# Patient Record
Sex: Female | Born: 1960
Health system: Southern US, Community
[De-identification: ages and names within clinical notes are randomized; demographics above are authoritative.]

---

## 1999-11-20 ENCOUNTER — Other Ambulatory Visit: Admission: RE | Admit: 1999-11-20 | Discharge: 1999-11-20 | Payer: Self-pay | Admitting: Obstetrics and Gynecology

## 2000-12-08 ENCOUNTER — Other Ambulatory Visit: Admission: RE | Admit: 2000-12-08 | Discharge: 2000-12-08 | Payer: Self-pay | Admitting: Obstetrics and Gynecology

## 2002-01-30 ENCOUNTER — Other Ambulatory Visit: Admission: RE | Admit: 2002-01-30 | Discharge: 2002-01-30 | Payer: Self-pay | Admitting: Obstetrics and Gynecology

## 2002-03-02 ENCOUNTER — Ambulatory Visit (HOSPITAL_COMMUNITY): Admission: RE | Admit: 2002-03-02 | Discharge: 2002-03-02 | Payer: Self-pay | Admitting: General Surgery

## 2003-02-25 ENCOUNTER — Other Ambulatory Visit: Admission: RE | Admit: 2003-02-25 | Discharge: 2003-02-25 | Payer: Self-pay | Admitting: Obstetrics and Gynecology

## 2004-03-20 ENCOUNTER — Other Ambulatory Visit: Admission: RE | Admit: 2004-03-20 | Discharge: 2004-03-20 | Payer: Self-pay | Admitting: Obstetrics and Gynecology

## 2005-07-07 ENCOUNTER — Other Ambulatory Visit: Admission: RE | Admit: 2005-07-07 | Discharge: 2005-07-07 | Payer: Self-pay | Admitting: Obstetrics and Gynecology

## 2005-10-11 ENCOUNTER — Encounter (INDEPENDENT_AMBULATORY_CARE_PROVIDER_SITE_OTHER): Payer: Self-pay | Admitting: Specialist

## 2005-10-11 ENCOUNTER — Ambulatory Visit (HOSPITAL_COMMUNITY): Admission: RE | Admit: 2005-10-11 | Discharge: 2005-10-12 | Payer: Self-pay | Admitting: Obstetrics and Gynecology

## 2007-09-01 ENCOUNTER — Encounter (INDEPENDENT_AMBULATORY_CARE_PROVIDER_SITE_OTHER): Payer: Self-pay | Admitting: General Surgery

## 2007-09-01 ENCOUNTER — Ambulatory Visit (HOSPITAL_COMMUNITY): Admission: RE | Admit: 2007-09-01 | Discharge: 2007-09-01 | Payer: Self-pay | Admitting: General Surgery

## 2010-02-05 ENCOUNTER — Ambulatory Visit (HOSPITAL_COMMUNITY): Admission: RE | Admit: 2010-02-05 | Discharge: 2010-02-05 | Payer: Self-pay | Admitting: Neurological Surgery

## 2010-08-28 LAB — COMPREHENSIVE METABOLIC PANEL
Albumin: 4.3 g/dL (ref 3.5–5.2)
BUN: 9 mg/dL (ref 6–23)
CO2: 27 mEq/L (ref 19–32)
Chloride: 104 mEq/L (ref 96–112)
Glucose, Bld: 105 mg/dL — ABNORMAL HIGH (ref 70–99)
Total Bilirubin: 0.9 mg/dL (ref 0.3–1.2)

## 2010-08-28 LAB — LIPID PANEL
HDL: 50 mg/dL (ref >39)
LDL Cholesterol: 136 mg/dL — ABNORMAL HIGH (ref 0–99)
Triglycerides: 121 mg/dL (ref <150)
VLDL: 24 mg/dL (ref 0–40)

## 2010-08-28 LAB — CBC
HCT: 43 % (ref 36.0–46.0)
HCT: 43.4 % (ref 36.0–46.0)
Hemoglobin: 14.3 g/dL (ref 12.0–15.0)
Hemoglobin: 14.6 g/dL (ref 12.0–15.0)
MCH: 29.8 pg (ref 26.0–34.0)
MCH: 30.2 pg (ref 26.0–34.0)
MCHC: 33.6 g/dL (ref 30.0–36.0)
MCV: 89.6 fL (ref 78.0–100.0)
MCV: 89.9 fL (ref 78.0–100.0)
Platelets: 324 10*3/uL (ref 150–400)
RBC: 4.8 MIL/uL (ref 3.87–5.11)

## 2010-08-28 LAB — SURGICAL PCR SCREEN
MRSA, PCR: NEGATIVE
Staphylococcus aureus: NEGATIVE

## 2010-10-27 NOTE — H&P (Signed)
[name], [name]              ACCOUNT NO.:  [id number]   MEDICAL RECORD NO.:  [id number]           PATIENT TYPE:  AMB   LOCATION:  DAY                           FACILITY:  [facility]   PHYSICIAN:  Dalia Heading, M.D.  DATE OF BIRTH:  1961/02/20   DATE OF ADMISSION:  DATE OF DISCHARGE:  LH                              HISTORY & PHYSICAL   CHIEF COMPLAINT:  Abdominal pain, diarrhea.   HISTORY OF PRESENT ILLNESS:  The patient is a 50 year old white female  who is referred for endoscopic evaluation.  She needs a colonoscopy for  abdominal pain, diarrhea, constipation.  She has been having these  symptoms over the past few months.  Amitiza makes her feel better.  No  weight loss, nausea, vomiting, melena, hematochezia has been noted.  She  has never had a colonoscopy.  A maternal grandfather had colon cancer.   PAST MEDICAL HISTORY:  Includes depression.   PAST SURGICAL HISTORY:  Laparoscopic cholecystectomy.   CURRENT MEDICATIONS:  Nexium, Claritin D, trazodone, Amitiza.   ALLERGIES:  SULFA AND PENICILLIN.   REVIEW OF SYSTEMS:  The patient denies drinking or smoking.  She denies  any other cardiopulmonary difficulties or bleeding disorders.   PHYSICAL EXAMINATION:  GENERAL:  The patient is a well-developed, and  well-nourished white female in no acute distress.  LUNGS:  Clear to auscultation with equal breath sounds bilaterally.  HEART:  Reveals a regular rate and rhythm without S3, S4, or murmurs.  ABDOMEN:  Soft, nontender, nondistended.  No hepatosplenomegaly or  masses are noted.  RECTAL:  Deferred until the procedure.   IMPRESSION:  Abdominal pain, diarrhea.   PLAN:  The patient is scheduled for a colonoscopy on September 01, 2007.  The risks and benefits of the procedure including bleeding and  perforation were fully explained to the patient, gave informed consent.      Dalia Heading, M.D.  Electronically Signed     MAJ/MEDQ  D:  08/08/2007  T:  08/09/2007  Job:   [id number]   cc:   Donzetta Sprung  Fax: [phone]

## 2010-10-27 NOTE — H&P (Signed)
[name], [name]              ACCOUNT NO.:  [id number]   MEDICAL RECORD NO.:  [id number]           PATIENT TYPE:  AMB   LOCATION:  DAY                           FACILITY:  [facility]   PHYSICIAN:  Dalia Heading, M.D.  DATE OF BIRTH:  Mar 10, 1961   DATE OF ADMISSION:  DATE OF DISCHARGE:  LH                              HISTORY & PHYSICAL   CHIEF COMPLAINT:  Abdominal pain, diarrhea.   HISTORY OF PRESENT ILLNESS:  The patient is a 50 year old white female  who is referred for endoscopic evaluation.  She needs a colonoscopy for  abdominal pain, diarrhea, constipation.  She has been having these  symptoms over the past few months.  Amitiza makes her feel better.  No  weight loss, nausea, vomiting, melena, hematochezia has been noted.  She  has never had a colonoscopy.  A maternal grandfather had colon cancer.   PAST MEDICAL HISTORY:  Includes depression.   PAST SURGICAL HISTORY:  Laparoscopic cholecystectomy.   CURRENT MEDICATIONS:  Nexium, Claritin D, trazodone, Amitiza.   ALLERGIES:  SULFA AND PENICILLIN.   REVIEW OF SYSTEMS:  The patient denies drinking or smoking.  She denies  any other cardiopulmonary difficulties or bleeding disorders.   PHYSICAL EXAMINATION:  GENERAL:  The patient is a well-developed, and  well-nourished white female in no acute distress.  LUNGS:  Clear to auscultation with equal breath sounds bilaterally.  HEART:  Reveals a regular rate and rhythm without S3, S4, or murmurs.  ABDOMEN:  Soft, nontender, nondistended.  No hepatosplenomegaly or  masses are noted.  RECTAL:  Deferred until the procedure.   IMPRESSION:  Abdominal pain, diarrhea.   PLAN:  The patient is scheduled for a colonoscopy on September 01, 2007.  The risks and benefits of the procedure including bleeding and  perforation were fully explained to the patient, gave informed consent.      Dalia Heading, M.D.  Electronically Signed     MAJ/MEDQ  D:  08/08/2007  T:  08/09/2007  Job:   [id number]   cc:   Donzetta Sprung  Fax: [phone]

## 2010-10-30 NOTE — H&P (Signed)
NAMEJACKLIN, [name]              ACCOUNT NO.:  [id number]   MEDICAL RECORD NO.:  [id number]          PATIENT TYPE:  AMB   LOCATION:  SDC                           FACILITY:  [facility]   PHYSICIAN:  Duke Salvia. Marcelle Overlie, M.D.DATE OF BIRTH:  [birth year]   DATE OF ADMISSION:  DATE OF DISCHARGE:                                HISTORY & PHYSICAL   DATE OF SURGERY:  Patient is scheduled for surgery October 11, 2005 at [facility].   CHIEF COMPLAINT:  Post-coital bleeding, abnormal uterine bleeding,  dyspareunia.   HISTORY OF PRESENT ILLNESS:  The patient is a 50 year old G1, P1.  Her  husband has had a vasectomy.  Over the last six to nine months this patient  has noticed some worsening spotting after intercourse and also deep  dyspareunia.  Ultrasound in our office showed a 5 mm polyp but no other  abnormalities noted.  Her last Pap in January 2007 was class I.   PAST MEDICAL HISTORY:  Patient delivered vaginally in 1997.   SOCIAL HISTORY:  She has one daughter.   ALLERGIES:  PENICILLIN, CODEINE.   CURRENT MEDICATIONS:  Nexium and Zyrtec.   REVIEW OF SYSTEMS:  Significant for a history of anovulation.  At one point  she was on Clomid for relative infertility.   FAMILY HISTORY:  Otherwise remarkable for mother and brother who both have  only one kidney.   PHYSICAL EXAMINATION:  VITAL SIGNS:  Temperature 98.2, blood pressure  120/72.  HEENT:  Unremarkable.  NECK:  Supple without masses.  LUNGS:  Clear.  CARDIOVASCULAR:  Regular rate and rhythm without murmurs, rubs or gallops  noted.  BREASTS:  Without masses.  ABDOMEN:  Soft, flat, nontender.  PELVIC:  Examination shows normal external vagina. Vulva and cervix clear.  Uterus mid position, normal size.  Adnexa negative.  EXTREMITIES:  Unremarkable.  NEUROLOGICAL:  Unremarkable.   IMPRESSION:  Endometrial polyp with history of abnormal bleeding,  dyspareunia and post-coital bleeding.   PLAN:  Laparoscopically assisted  vaginal hysterectomy.  This procedure  including risks of bleeding, infection, adjacent organ injury, the possible  need for open or additional surgery along with her expectant recovery time  were all reviewed with her, which she understands and accepts.      Richard M. Marcelle Overlie, M.D.  Electronically Signed     RMH/MEDQ  D:  10/05/2005  T:  10/05/2005  Job:  [id number]

## 2010-10-30 NOTE — Op Note (Signed)
[name], [name]              ACCOUNT NO.:  [id number]   MEDICAL RECORD NO.:  [id number]          PATIENT TYPE:  AMB   LOCATION:  SDC                           [facility]ACILITY:  [facility]   PHYSICIAN:  Duke Salvia. Marcelle Overlie, M.D.DATE OF BIRTH:  1960/10/28   DATE OF PROCEDURE:  10/11/2005  DATE OF DISCHARGE:                                 OPERATIVE REPORT   PREOP DIAGNOSIS:  Dyspareunia abnormal uterine bleeding.   POSTOP DIAGNOSIS:  Dyspareunia abnormal uterine bleeding plus endometriosis.   PROCEDURE:  Diagnostic laparoscopy with laparoscopically assisted vaginal  hysterectomy.   SURGEON:  Duke Salvia. Marcelle Overlie, M.D.   ASSISTANT:  Zelphia Cairo, MD   ESTIMATED BLOOD LOSS:  100 mL.   SPECIMENS REMOVED:  Uterus.   PROCEDURE AND FINDINGS:  The patient was taken to the operating room after  an adequate level of general endotracheal anesthesia was obtained. With the  patient's legs in the stirrups, the abdomen, perineum, and vagina were  prepped and draped with Betadine.  The bladder was drained.  An EUA carried  out.  The uterus was in midposition, normal size, mobile, adnexa negative.  A Hulka tenaculum was positioned.   Attention directed to the abdomen where a 2 cm subumbilical incision was  made after infiltrating with 1/2% Marcaine plain.  The Veress needle was  introduced without difficulty.  Its intra-abdominal position was verified by  pressure and water testing.  After a 2.5 liter pneumoperitoneum was then  created a laparoscopic trocar and sleeve were then introduced without  difficulty.  Three fingerbreadths above the symphysis, in the midline, a 5-  mm trocar was inserted without difficulty.  Her bladder had been drained  preoperatively.  She was then placed in Trendelenburg and pelvic findings  were inspected.  The anterior and posterior cul-de-sac was unremarkable.  Bilateral tubes and ovaries were normal.  There was a single implant of  superficial endometriosis on the  left pelvic sidewall above the course of  the pelvic ureter on that side.  The gyrus PK instrument was then used to  coagulate and cut the utero-ovarian pedicle, down to and including the round  ligament on each side with excellent hemostasis.  After the course of the  ureter was traced on the left side and could be seen well below, the  solitary implant along the left pelvic sidewall was coagulated superficially  with the gyrus PK instrument.  This was not excised.  The vaginal portion of  the procedure was started at that point.   A weighted speculum was positioned.  Her legs were extended.  The cervical  vaginal mucosa was incised with a Bovie.  Posterior colpotomy performed  without difficulty.  The bladder was advanced superiorly.  The uterosacral  ligament was coagulated and cut with the handheld gyrus PK instrument.  The  anterior peritoneum could be identified; was entered sharply; and the  bladder was gently elevated out of the field.  A small anterior lower fundal  fibroid was removed.  In sequential manner the cardinal ligament and uterine  vascular pedicles were coagulated and cut with the gyrus PK  instrument.  The  fundus of the uterus was then delivered posteriorly.  The remainder of the  pedicles were clamped, cut, and free tied with #0 Vicryl suture.  The cuff  was closed from 3-9 o'clock with a running locked 2-0 Vicryl suture.  A  McCall's culdoplasty suture was then positioned taking up the posterior  peritoneum.  After left uterosacral leg ligament across the right  uterosacral ligament was then tied for extra posterior support.  Prior to  closure the sponge, needle, and instrument counts were reported as correct  x2.  Then a 2-0 Monocryl suture was then used to approximate the vaginal  mucosa from right to left with interrupted sutures.  A Foley catheter  positioned draining clear urine.   Repeat laparoscopy carried out, at this point, The _Nehzhat_ was used to   irrigate and aspirate with reduced pressure.  All major pedicles were noted  to be hemostatic.  Instruments were removed.  Gas was allowed to escape.  The defect was closed with 4-0 Vicryl subcuticular sutures and Dermabond.  She tolerated this well and went to recovery room in good condition.      Richard M. Marcelle Overlie, M.D.  Electronically Signed     RMH/MEDQ  D:  10/11/2005  T:  10/11/2005  Job:  [id number]

## 2010-10-30 NOTE — Op Note (Signed)
[name], [name]                          ACCOUNT NO.:  [id number]   MEDICAL RECORD NO.:  [id number]                   PATIENT TYPE:  AMB   LOCATION:  DAY                                  FACILITY:  [facility]   PHYSICIAN:  Dalia Heading, M.D.               DATE OF BIRTH:  08-01-1960   DATE OF PROCEDURE:  03/02/2002  DATE OF DISCHARGE:                                 OPERATIVE REPORT   PREOPERATIVE DIAGNOSIS:  Chronic cholecystitis, cholelithiasis.   POSTOPERATIVE DIAGNOSIS:  Chronic cholecystitis, cholelithiasis.   PROCEDURE:  Laparoscopic cholecystectomy   SURGEON:  Dalia Heading, M.D.   ANESTHESIA:  General endotracheal   INDICATIONS:  The patient is a 50 year old white female who presents with  biliary colic secondary to cholelithiasis.  The risks and benefits of the  procedure including bleeding, infection, hepatobiliary injury, and the  possibility of an open procedure were fully explained to the patient, who  gave informed consent.   DESCRIPTION OF PROCEDURE:  The patient was placed in the supine position.  After induction of general endotracheal anesthesia, the abdomen was prepped  and draped using the usual sterile technique with Betadine.   An infraumbilical incision was made down to the fascia.  A Veress needle was  introduced into the abdominal cavity and confirmation of placement was done  using the saline drop test.  The abdomen was then insufflated to 16 mmHg  pressure.  An 11-mm trocar was introduced into the abdominal cavity under  direct visualization without difficulty.  The patient was placed then in  reverse Trendelenburg position and an additional 11-mm trocar was placed in  the epigastric region and 5-mm trocars were placed in the right upper  quadrant and right flank regions. The liver was inspected and noted to be  within normal limits.  The gallbladder was retracted superiorly and  laterally.  The dissection was begun around the infundibulum of  the  gallbladder.  The cystic duct was first identified.  Its junction to the  infundibulum fully identified.  Endoclips were placed proximally and  distally on the cystic duct; and the cystic duct was divided.  This was  likewise done on the cystic artery.  The gallbladder was then freed away  from the gallbladder fossa using Bovie electrocautery.  The gallbladder was  delivered through the epigastric trocar site using an EndoCatch bag.  The  gallbladder fossa was inspected and no abnormal bleeding or bile leakage was  noted.  Surgicel was placed in the gallbladder fossa, the subhepatic space,  as well as the right hepatic gutter irrigated with normal saline.  All fluid  and air were then evacuated from the abdominal cavity prior to removal of  the trocars.   All wounds were irrigated with normal saline.  All wounds were injected with  0.5% Sensorcaine.  The epigastric fascia as well as infraumbilical fascia  were reapproximated using an #0  Vicryl interrupted suture. All skin  incisions were closed using staples.  Betadine ointment and dry sterile  dressings were applied.   All tape and needle counts correct at the end of the procedure.  The patient  was extubated in the operating room and went back to recovery room in awake  and stable condition.   COMPLICATIONS:  None.   SPECIMENS:  Gallbladder with stones.   BLOOD LOSS:  Minimal.                                               Dalia Heading, M.D.    MAJ/MEDQ  D:  03/02/2002  T:  03/04/2002  Job:  [id number]   cc:   Donzetta Sprung  [address]  [place]  [place] [address]  Fax: [phone]

## 2010-10-30 NOTE — H&P (Signed)
   NAME:  [name], [name] NO.:  [id number]   MEDICAL RECORD NO.:  [id number]                   PATIENT TYPE:   LOCATION:                                       FACILITY:   PHYSICIAN:  Dalia Heading, M.D.               DATE OF BIRTH:  [birth year]   DATE OF ADMISSION:  DATE OF DISCHARGE:                                HISTORY & PHYSICAL   CHIEF COMPLAINT:  Biliary colic, cholelithiasis.   HISTORY OF PRESENT ILLNESS:  The patient is a 50 year old white female who  is referred for evaluation and treatment of biliary colic secondary to  cholelithiasis.  She has been having right upper quadrant abdominal pain,  indigestion, and nausea intermittently for many months.  It seems to be  getting worse.  No fever, chills, emesis, or jaundice have been noted.   PAST MEDICAL HISTORY:  1. GERD.  2. Extrinsic allergies.   PAST SURGICAL HISTORY:  Unremarkable.   CURRENT MEDICATIONS:  Zyrtec, Nexium, Rhinocort.   ALLERGIES:  PENICILLIN.   REVIEW OF SYSTEMS:  Unremarkable.   PHYSICAL EXAMINATION:  GENERAL:  Well-developed, well-nourished white female  in no acute distress.  VITAL SIGNS:  Afebrile, and vital signs are stable.  HEENT:  No scleral icterus.  LUNGS:  Clear to auscultation with equal breath sounds bilaterally.  HEART:  Regular rate and rhythm.  Without S3, S4, or murmurs.  ABDOMEN:  Soft, with slight tenderness noted in the right upper quadrant to  palpation.  No hepatosplenomegaly, masses, or hernias are identified.   LABORATORY DATA:  Ultrasound of the gallbladder reveals cholelithiasis with  a normal common bile duct.   IMPRESSION:  Biliary colic, cholelithiasis.    PLAN:  The patient is scheduled for laparoscopic cholecystectomy on  March 02, 2002.  The risks and benefits of the procedure including  bleeding, infection, hepatobiliary injury, and the possibility of an open  procedure were fully explained to the patient, who gave informed  consent.                                                  Dalia Heading, M.D.    MAJ/MEDQ  D:  02/08/2002  T:  02/08/2002  Job:  [id number]   cc:   Donzetta Sprung, M.D.  [employer]

## 2010-10-30 NOTE — Discharge Summary (Signed)
NAMEHELEN, [name]              ACCOUNT NO.:  [id number]   MEDICAL RECORD NO.:  [id number]          PATIENT TYPE:  OIB   LOCATION:  9305                          FACILITY:  [facility]   PHYSICIAN:  Duke Salvia. Marcelle Overlie, M.D.DATE OF BIRTH:  1960/11/20   DATE OF ADMISSION:  10/11/2005  DATE OF DISCHARGE:  10/12/2005                                 DISCHARGE SUMMARY   DISCHARGE DIAGNOSES:  1.  Menorrhagia, dyspareunia.  2.  Laparoscopic assisted vaginal hysterectomy this admission.  3.  Mild pelvic endometriosis.  4.  Leiomyoma.   For summary of the history and physical exam please refer to admission H&P  for details. Briefly, a 50 year old who has symptomatic menorrhagia with  dyspareunia and presents for definitive hysterectomy.   HOSPITAL COURSE:  On October 11, 2005 under general anesthesia the patient  underwent laparoscopic assisted vaginal hysterectomy with conservation of  both ovaries, minimal peritoneal endometriosis was noted. On the first  postoperative day the catheter was removed that morning. Hemoglobin 9.9. She  voided without difficulty, was afebrile, tolerating a regular diet and was  ready for discharge by 1 p.m.   OTHER LAB DATA:  CBC on admission:  WBC 8200, hemoglobin 14.5, hematocrit  42.6. Blood type is O positive, antibody screen was negative. On Oct 12, 2005  WBC 18 thousand, hemoglobin 9.9, hematocrit 29.1.   DISPOSITION:  The patient was discharged on Tylox p.r.n. pain. Will return  to the office in 3 days. Advised her to report any incisional redness or  drainage, increased pain or bleeding or fever over 101. She was given  specific instructions regarding diet, sex, exercise.   CONDITION ON DISCHARGE:  Good.   ACTIVITY:  Graded increase.      Richard M. Marcelle Overlie, M.D.  Electronically Signed     RMH/MEDQ  D:  10/12/2005  T:  10/12/2005  Job:  [id number]

## 2011-04-16 IMAGING — CR DG CERVICAL SPINE 2 OR 3 VIEWS
1 series · 1 of 1 positions shown · non-contrast
Comparison: None

CLINICAL DATA: ACDF.

CERVICAL SPINE - 2-3 VIEW

[view not recorded]
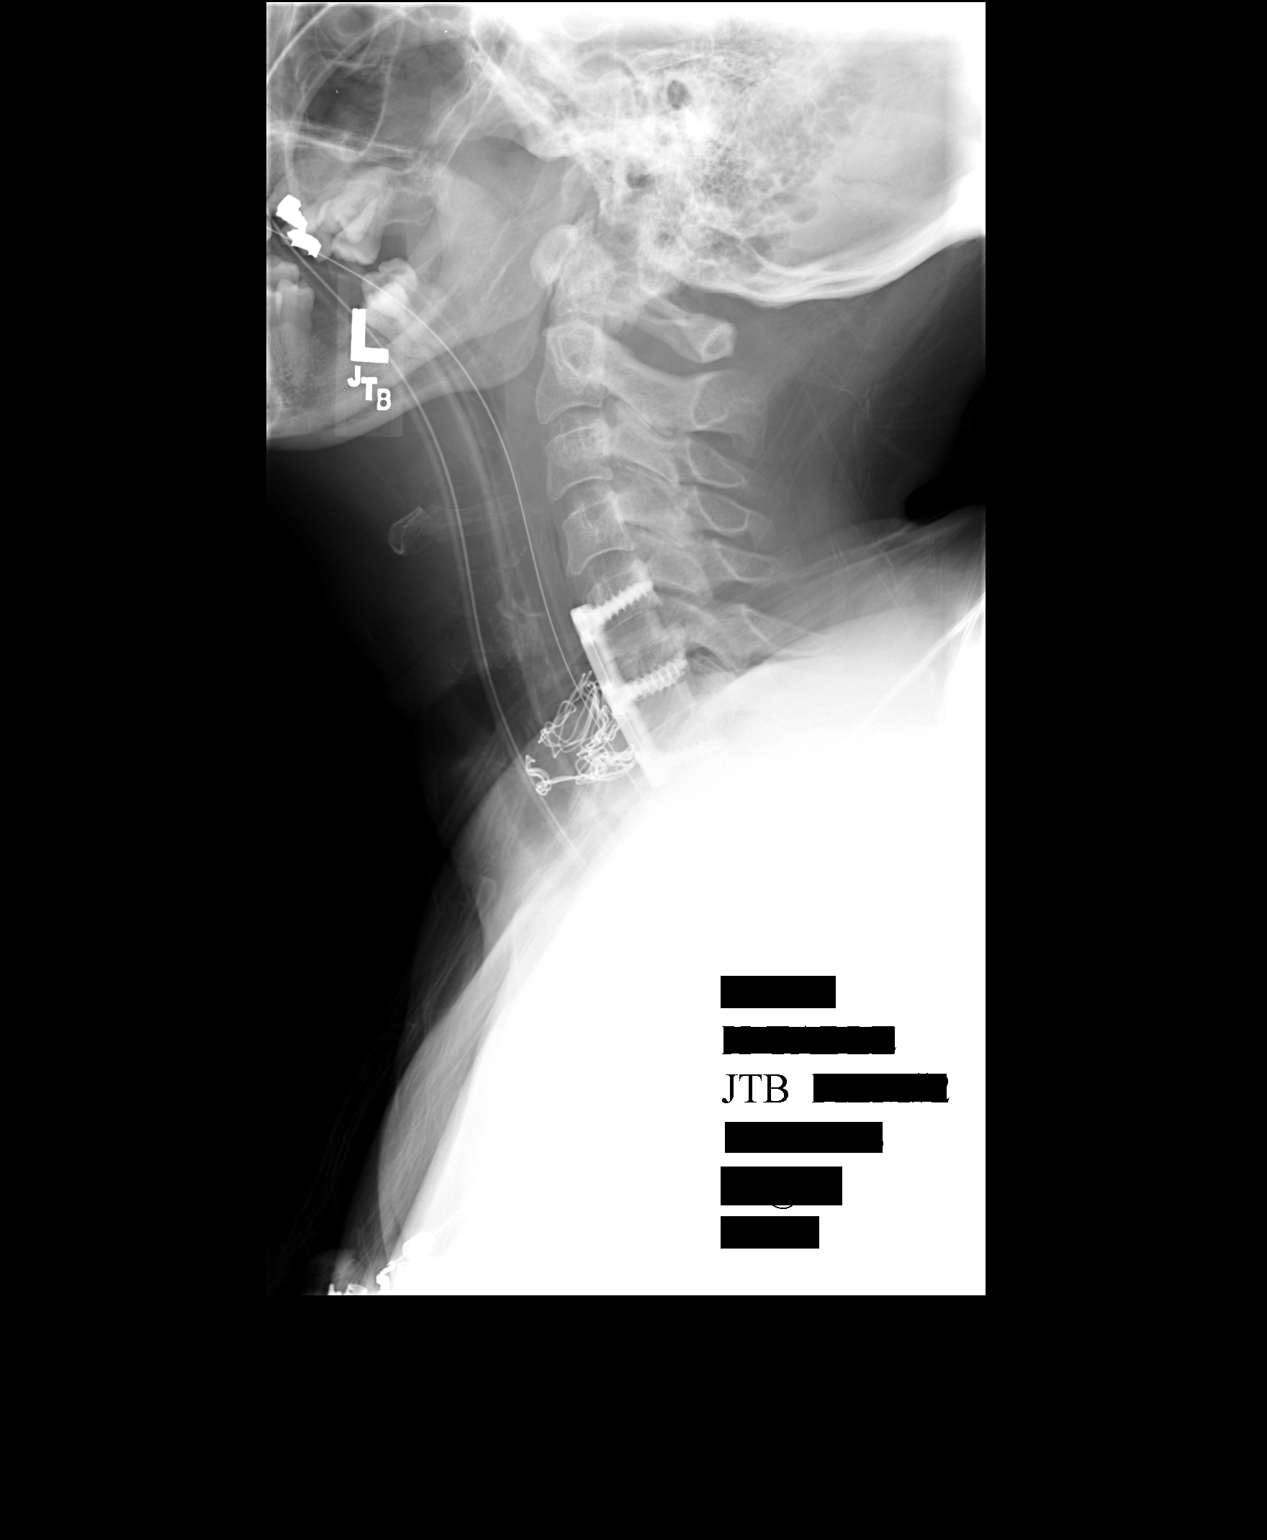

[1 of 1 positions shown; findings below may reference images not displayed]

FINDINGS: First lateral intraoperative image demonstrates anterior
localizing instrument at C5-6.

Second lateral intraoperative image demonstrates changes of ACDF
from C5-C7.  Normal alignment.  The C7 vertebral body is not well
visualized.  The screws at C5 and C6 are near the posterior edge of
the vertebral body.  There is questionable extension of one of the
C6 screws through the posterior aspect of the C6 vertebral body.
Dedicated c-spine series may be helpful when the patient is able.
IMPRESSION: ACDF C5-C7 as above.  Questionable slight extension of one of the
C6 vertebral screws through the posterior aspect of C6.

## 2015-10-02 DIAGNOSIS — F419 Anxiety disorder, unspecified: Secondary | ICD-10-CM | POA: Diagnosis not present

## 2015-10-10 DIAGNOSIS — Z01419 Encounter for gynecological examination (general) (routine) without abnormal findings: Secondary | ICD-10-CM | POA: Diagnosis not present

## 2015-10-10 DIAGNOSIS — Z1231 Encounter for screening mammogram for malignant neoplasm of breast: Secondary | ICD-10-CM | POA: Diagnosis not present

## 2015-10-10 DIAGNOSIS — Z6826 Body mass index (BMI) 26.0-26.9, adult: Secondary | ICD-10-CM | POA: Diagnosis not present

## 2015-11-24 DIAGNOSIS — L299 Pruritus, unspecified: Secondary | ICD-10-CM | POA: Diagnosis not present

## 2015-12-08 DIAGNOSIS — R300 Dysuria: Secondary | ICD-10-CM | POA: Diagnosis not present

## 2016-02-05 DIAGNOSIS — K589 Irritable bowel syndrome without diarrhea: Secondary | ICD-10-CM | POA: Diagnosis not present

## 2016-02-05 DIAGNOSIS — J301 Allergic rhinitis due to pollen: Secondary | ICD-10-CM | POA: Diagnosis not present

## 2016-02-05 DIAGNOSIS — F331 Major depressive disorder, recurrent, moderate: Secondary | ICD-10-CM | POA: Diagnosis not present

## 2016-02-05 DIAGNOSIS — K219 Gastro-esophageal reflux disease without esophagitis: Secondary | ICD-10-CM | POA: Diagnosis not present

## 2016-02-05 DIAGNOSIS — Z1212 Encounter for screening for malignant neoplasm of rectum: Secondary | ICD-10-CM | POA: Diagnosis not present

## 2016-02-26 DIAGNOSIS — J019 Acute sinusitis, unspecified: Secondary | ICD-10-CM | POA: Diagnosis not present

## 2016-03-29 DIAGNOSIS — Z23 Encounter for immunization: Secondary | ICD-10-CM | POA: Diagnosis not present

## 2016-05-03 DIAGNOSIS — Z23 Encounter for immunization: Secondary | ICD-10-CM | POA: Diagnosis not present

## 2016-05-31 DIAGNOSIS — Z6827 Body mass index (BMI) 27.0-27.9, adult: Secondary | ICD-10-CM | POA: Diagnosis not present

## 2016-05-31 DIAGNOSIS — J019 Acute sinusitis, unspecified: Secondary | ICD-10-CM | POA: Diagnosis not present

## 2016-07-07 DIAGNOSIS — R300 Dysuria: Secondary | ICD-10-CM | POA: Diagnosis not present

## 2016-08-05 DIAGNOSIS — J301 Allergic rhinitis due to pollen: Secondary | ICD-10-CM | POA: Diagnosis not present

## 2016-08-05 DIAGNOSIS — F331 Major depressive disorder, recurrent, moderate: Secondary | ICD-10-CM | POA: Diagnosis not present

## 2016-08-05 DIAGNOSIS — K589 Irritable bowel syndrome without diarrhea: Secondary | ICD-10-CM | POA: Diagnosis not present

## 2016-08-05 DIAGNOSIS — K219 Gastro-esophageal reflux disease without esophagitis: Secondary | ICD-10-CM | POA: Diagnosis not present

## 2016-08-23 DIAGNOSIS — J069 Acute upper respiratory infection, unspecified: Secondary | ICD-10-CM | POA: Diagnosis not present

## 2016-08-23 DIAGNOSIS — L299 Pruritus, unspecified: Secondary | ICD-10-CM | POA: Diagnosis not present

## 2016-11-24 DIAGNOSIS — R300 Dysuria: Secondary | ICD-10-CM | POA: Diagnosis not present

## 2017-02-11 DIAGNOSIS — Z01419 Encounter for gynecological examination (general) (routine) without abnormal findings: Secondary | ICD-10-CM | POA: Diagnosis not present

## 2017-02-11 DIAGNOSIS — Z6825 Body mass index (BMI) 25.0-25.9, adult: Secondary | ICD-10-CM | POA: Diagnosis not present

## 2017-02-11 DIAGNOSIS — Z1231 Encounter for screening mammogram for malignant neoplasm of breast: Secondary | ICD-10-CM | POA: Diagnosis not present

## 2017-03-16 DIAGNOSIS — Z23 Encounter for immunization: Secondary | ICD-10-CM | POA: Diagnosis not present

## 2017-04-28 DIAGNOSIS — J019 Acute sinusitis, unspecified: Secondary | ICD-10-CM | POA: Diagnosis not present

## 2017-04-28 DIAGNOSIS — Z6825 Body mass index (BMI) 25.0-25.9, adult: Secondary | ICD-10-CM | POA: Diagnosis not present

## 2017-05-09 DIAGNOSIS — K219 Gastro-esophageal reflux disease without esophagitis: Secondary | ICD-10-CM | POA: Diagnosis not present

## 2017-05-09 DIAGNOSIS — K589 Irritable bowel syndrome without diarrhea: Secondary | ICD-10-CM | POA: Diagnosis not present

## 2017-05-09 DIAGNOSIS — F331 Major depressive disorder, recurrent, moderate: Secondary | ICD-10-CM | POA: Diagnosis not present

## 2017-05-09 DIAGNOSIS — J301 Allergic rhinitis due to pollen: Secondary | ICD-10-CM | POA: Diagnosis not present

## 2017-06-28 DIAGNOSIS — J019 Acute sinusitis, unspecified: Secondary | ICD-10-CM | POA: Diagnosis not present

## 2017-09-13 DIAGNOSIS — J309 Allergic rhinitis, unspecified: Secondary | ICD-10-CM | POA: Diagnosis not present

## 2017-09-22 DIAGNOSIS — R1084 Generalized abdominal pain: Secondary | ICD-10-CM | POA: Diagnosis not present

## 2017-09-22 DIAGNOSIS — Z6825 Body mass index (BMI) 25.0-25.9, adult: Secondary | ICD-10-CM | POA: Diagnosis not present

## 2017-09-22 DIAGNOSIS — R1110 Vomiting, unspecified: Secondary | ICD-10-CM | POA: Diagnosis not present

## 2017-11-14 DIAGNOSIS — K219 Gastro-esophageal reflux disease without esophagitis: Secondary | ICD-10-CM | POA: Diagnosis not present

## 2017-11-14 DIAGNOSIS — K589 Irritable bowel syndrome without diarrhea: Secondary | ICD-10-CM | POA: Diagnosis not present

## 2017-11-14 DIAGNOSIS — J301 Allergic rhinitis due to pollen: Secondary | ICD-10-CM | POA: Diagnosis not present

## 2017-11-14 DIAGNOSIS — F331 Major depressive disorder, recurrent, moderate: Secondary | ICD-10-CM | POA: Diagnosis not present

## 2017-11-14 DIAGNOSIS — J0100 Acute maxillary sinusitis, unspecified: Secondary | ICD-10-CM | POA: Diagnosis not present

## 2018-01-13 DIAGNOSIS — J019 Acute sinusitis, unspecified: Secondary | ICD-10-CM | POA: Diagnosis not present

## 2018-01-13 DIAGNOSIS — Z6824 Body mass index (BMI) 24.0-24.9, adult: Secondary | ICD-10-CM | POA: Diagnosis not present

## 2018-03-22 DIAGNOSIS — Z23 Encounter for immunization: Secondary | ICD-10-CM | POA: Diagnosis not present

## 2018-04-10 DIAGNOSIS — R0981 Nasal congestion: Secondary | ICD-10-CM | POA: Diagnosis not present

## 2018-04-10 DIAGNOSIS — Z6826 Body mass index (BMI) 26.0-26.9, adult: Secondary | ICD-10-CM | POA: Diagnosis not present

## 2018-04-10 DIAGNOSIS — H9203 Otalgia, bilateral: Secondary | ICD-10-CM | POA: Diagnosis not present

## 2018-04-13 DIAGNOSIS — Z01419 Encounter for gynecological examination (general) (routine) without abnormal findings: Secondary | ICD-10-CM | POA: Diagnosis not present

## 2018-04-13 DIAGNOSIS — Z1231 Encounter for screening mammogram for malignant neoplasm of breast: Secondary | ICD-10-CM | POA: Diagnosis not present

## 2018-04-13 DIAGNOSIS — Z6825 Body mass index (BMI) 25.0-25.9, adult: Secondary | ICD-10-CM | POA: Diagnosis not present

## 2018-05-22 DIAGNOSIS — Z6825 Body mass index (BMI) 25.0-25.9, adult: Secondary | ICD-10-CM | POA: Diagnosis not present

## 2018-05-22 DIAGNOSIS — J019 Acute sinusitis, unspecified: Secondary | ICD-10-CM | POA: Diagnosis not present

## 2018-05-31 DIAGNOSIS — K219 Gastro-esophageal reflux disease without esophagitis: Secondary | ICD-10-CM | POA: Diagnosis not present

## 2018-05-31 DIAGNOSIS — J301 Allergic rhinitis due to pollen: Secondary | ICD-10-CM | POA: Diagnosis not present

## 2018-05-31 DIAGNOSIS — K589 Irritable bowel syndrome without diarrhea: Secondary | ICD-10-CM | POA: Diagnosis not present

## 2018-05-31 DIAGNOSIS — F331 Major depressive disorder, recurrent, moderate: Secondary | ICD-10-CM | POA: Diagnosis not present

## 2018-07-06 ENCOUNTER — Telehealth: Payer: Self-pay | Admitting: *Deleted

## 2018-07-31 DIAGNOSIS — R300 Dysuria: Secondary | ICD-10-CM | POA: Diagnosis not present

## 2018-08-21 DIAGNOSIS — J019 Acute sinusitis, unspecified: Secondary | ICD-10-CM | POA: Diagnosis not present

## 2018-10-23 DIAGNOSIS — R51 Headache: Secondary | ICD-10-CM | POA: Diagnosis not present

## 2018-10-23 DIAGNOSIS — J0101 Acute recurrent maxillary sinusitis: Secondary | ICD-10-CM | POA: Diagnosis not present

## 2018-12-06 DIAGNOSIS — F331 Major depressive disorder, recurrent, moderate: Secondary | ICD-10-CM | POA: Diagnosis not present

## 2018-12-06 DIAGNOSIS — F419 Anxiety disorder, unspecified: Secondary | ICD-10-CM | POA: Diagnosis not present

## 2018-12-06 DIAGNOSIS — R5382 Chronic fatigue, unspecified: Secondary | ICD-10-CM | POA: Diagnosis not present

## 2018-12-06 DIAGNOSIS — K210 Gastro-esophageal reflux disease with esophagitis: Secondary | ICD-10-CM | POA: Diagnosis not present

## 2018-12-13 DIAGNOSIS — K219 Gastro-esophageal reflux disease without esophagitis: Secondary | ICD-10-CM | POA: Diagnosis not present

## 2018-12-13 DIAGNOSIS — J301 Allergic rhinitis due to pollen: Secondary | ICD-10-CM | POA: Diagnosis not present

## 2018-12-13 DIAGNOSIS — K589 Irritable bowel syndrome without diarrhea: Secondary | ICD-10-CM | POA: Diagnosis not present

## 2018-12-13 DIAGNOSIS — F331 Major depressive disorder, recurrent, moderate: Secondary | ICD-10-CM | POA: Diagnosis not present

## 2018-12-21 ENCOUNTER — Other Ambulatory Visit: Payer: Self-pay

## 2018-12-21 DIAGNOSIS — Z1212 Encounter for screening for malignant neoplasm of rectum: Secondary | ICD-10-CM | POA: Insufficient documentation

## 2018-12-21 DIAGNOSIS — K589 Irritable bowel syndrome without diarrhea: Secondary | ICD-10-CM | POA: Insufficient documentation

## 2018-12-21 DIAGNOSIS — R519 Headache, unspecified: Secondary | ICD-10-CM

## 2018-12-21 DIAGNOSIS — F331 Major depressive disorder, recurrent, moderate: Secondary | ICD-10-CM | POA: Insufficient documentation

## 2018-12-21 DIAGNOSIS — Z6825 Body mass index (BMI) 25.0-25.9, adult: Secondary | ICD-10-CM | POA: Insufficient documentation

## 2018-12-21 DIAGNOSIS — K2100 Gastro-esophageal reflux disease with esophagitis, without bleeding: Secondary | ICD-10-CM

## 2018-12-21 DIAGNOSIS — J301 Allergic rhinitis due to pollen: Secondary | ICD-10-CM

## 2019-01-08 DIAGNOSIS — R21 Rash and other nonspecific skin eruption: Secondary | ICD-10-CM | POA: Diagnosis not present

## 2019-02-02 DIAGNOSIS — J0101 Acute recurrent maxillary sinusitis: Secondary | ICD-10-CM | POA: Diagnosis not present

## 2019-02-26 DIAGNOSIS — R21 Rash and other nonspecific skin eruption: Secondary | ICD-10-CM | POA: Diagnosis not present

## 2019-02-26 DIAGNOSIS — Z6825 Body mass index (BMI) 25.0-25.9, adult: Secondary | ICD-10-CM | POA: Diagnosis not present

## 2019-02-26 DIAGNOSIS — Z23 Encounter for immunization: Secondary | ICD-10-CM | POA: Diagnosis not present

## 2019-03-21 DIAGNOSIS — R21 Rash and other nonspecific skin eruption: Secondary | ICD-10-CM | POA: Diagnosis not present

## 2019-03-22 DIAGNOSIS — K219 Gastro-esophageal reflux disease without esophagitis: Secondary | ICD-10-CM | POA: Diagnosis not present

## 2019-03-22 DIAGNOSIS — R5382 Chronic fatigue, unspecified: Secondary | ICD-10-CM | POA: Diagnosis not present

## 2019-03-27 DIAGNOSIS — R300 Dysuria: Secondary | ICD-10-CM | POA: Diagnosis not present

## 2019-04-11 DIAGNOSIS — J0101 Acute recurrent maxillary sinusitis: Secondary | ICD-10-CM | POA: Diagnosis not present

## 2019-06-20 DIAGNOSIS — Z6826 Body mass index (BMI) 26.0-26.9, adult: Secondary | ICD-10-CM | POA: Diagnosis not present

## 2019-06-20 DIAGNOSIS — J301 Allergic rhinitis due to pollen: Secondary | ICD-10-CM | POA: Diagnosis not present

## 2019-06-20 DIAGNOSIS — K589 Irritable bowel syndrome without diarrhea: Secondary | ICD-10-CM | POA: Diagnosis not present

## 2019-06-20 DIAGNOSIS — F331 Major depressive disorder, recurrent, moderate: Secondary | ICD-10-CM | POA: Diagnosis not present

## 2019-06-22 DIAGNOSIS — J0101 Acute recurrent maxillary sinusitis: Secondary | ICD-10-CM | POA: Diagnosis not present

## 2019-06-27 DIAGNOSIS — L989 Disorder of the skin and subcutaneous tissue, unspecified: Secondary | ICD-10-CM | POA: Diagnosis not present

## 2019-06-27 DIAGNOSIS — C44619 Basal cell carcinoma of skin of left upper limb, including shoulder: Secondary | ICD-10-CM | POA: Diagnosis not present

## 2019-08-16 DIAGNOSIS — J019 Acute sinusitis, unspecified: Secondary | ICD-10-CM | POA: Diagnosis not present

## 2019-08-16 DIAGNOSIS — Z20828 Contact with and (suspected) exposure to other viral communicable diseases: Secondary | ICD-10-CM | POA: Diagnosis not present

## 2019-08-16 DIAGNOSIS — Z1231 Encounter for screening mammogram for malignant neoplasm of breast: Secondary | ICD-10-CM | POA: Diagnosis not present

## 2019-08-16 DIAGNOSIS — Z6827 Body mass index (BMI) 27.0-27.9, adult: Secondary | ICD-10-CM | POA: Diagnosis not present

## 2019-08-16 DIAGNOSIS — Z01419 Encounter for gynecological examination (general) (routine) without abnormal findings: Secondary | ICD-10-CM | POA: Diagnosis not present

## 2019-10-29 DIAGNOSIS — M79641 Pain in right hand: Secondary | ICD-10-CM | POA: Diagnosis not present

## 2019-12-13 DIAGNOSIS — J209 Acute bronchitis, unspecified: Secondary | ICD-10-CM | POA: Diagnosis not present

## 2019-12-20 DIAGNOSIS — Z23 Encounter for immunization: Secondary | ICD-10-CM | POA: Diagnosis not present

## 2020-01-15 DIAGNOSIS — K589 Irritable bowel syndrome without diarrhea: Secondary | ICD-10-CM | POA: Diagnosis not present

## 2020-01-15 DIAGNOSIS — K219 Gastro-esophageal reflux disease without esophagitis: Secondary | ICD-10-CM | POA: Diagnosis not present

## 2020-01-15 DIAGNOSIS — J301 Allergic rhinitis due to pollen: Secondary | ICD-10-CM | POA: Diagnosis not present

## 2020-01-15 DIAGNOSIS — F331 Major depressive disorder, recurrent, moderate: Secondary | ICD-10-CM | POA: Diagnosis not present

## 2020-01-21 DIAGNOSIS — Z23 Encounter for immunization: Secondary | ICD-10-CM | POA: Diagnosis not present

## 2020-01-24 DIAGNOSIS — Z20828 Contact with and (suspected) exposure to other viral communicable diseases: Secondary | ICD-10-CM | POA: Diagnosis not present

## 2020-01-24 DIAGNOSIS — J019 Acute sinusitis, unspecified: Secondary | ICD-10-CM | POA: Diagnosis not present

## 2020-05-14 DIAGNOSIS — J309 Allergic rhinitis, unspecified: Secondary | ICD-10-CM | POA: Diagnosis not present

## 2020-05-19 DIAGNOSIS — J019 Acute sinusitis, unspecified: Secondary | ICD-10-CM | POA: Diagnosis not present

## 2020-05-19 DIAGNOSIS — Z20828 Contact with and (suspected) exposure to other viral communicable diseases: Secondary | ICD-10-CM | POA: Diagnosis not present

## 2022-08-16 ENCOUNTER — Other Ambulatory Visit: Payer: Self-pay | Admitting: Obstetrics and Gynecology

## 2022-08-16 DIAGNOSIS — R928 Other abnormal and inconclusive findings on diagnostic imaging of breast: Secondary | ICD-10-CM

## 2022-08-21 ENCOUNTER — Ambulatory Visit
Admission: RE | Admit: 2022-08-21 | Discharge: 2022-08-21 | Disposition: A | Discharge status: Home / Self Care | Payer: BC Managed Care – PPO | Source: Ambulatory Visit | Attending: Obstetrics and Gynecology | Admitting: Obstetrics and Gynecology

## 2022-08-21 ENCOUNTER — Ambulatory Visit: Payer: Self-pay

## 2022-08-21 DIAGNOSIS — R928 Other abnormal and inconclusive findings on diagnostic imaging of breast: Secondary | ICD-10-CM
